# Patient Record
Sex: Male | Born: 2000
Health system: Southern US, Community
[De-identification: ages and names within clinical notes are randomized; demographics above are authoritative.]

## PROBLEM LIST (undated history)

## (undated) DIAGNOSIS — J45998 Other asthma: Secondary | ICD-10-CM

## (undated) DIAGNOSIS — Z9109 Other allergy status, other than to drugs and biological substances: Secondary | ICD-10-CM

## (undated) HISTORY — DX: Other allergy status, other than to drugs and biological substances: Z91.09

## (undated) HISTORY — DX: Other asthma: J45.998

---
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
## 2021-01-04 ENCOUNTER — Ambulatory Visit: Payer: 59 | Admitting: Physician Assistant

## 2021-01-04 ENCOUNTER — Other Ambulatory Visit: Payer: Self-pay

## 2021-01-26 ENCOUNTER — Ambulatory Visit: Payer: 59 | Admitting: Physician Assistant

## 2021-06-09 ENCOUNTER — Ambulatory Visit: Payer: 59 | Admitting: Nurse Practitioner

## 2021-11-22 ENCOUNTER — Encounter: Payer: Self-pay | Admitting: Nurse Practitioner

## 2021-11-22 ENCOUNTER — Ambulatory Visit (INDEPENDENT_AMBULATORY_CARE_PROVIDER_SITE_OTHER): Payer: 59 | Admitting: Nurse Practitioner

## 2021-11-22 VITALS — BP 126/80 | HR 103 | Temp 96.6°F | Resp 14 | Ht 71.0 in | Wt >= 6400 oz

## 2021-11-22 DIAGNOSIS — R7303 Prediabetes: Secondary | ICD-10-CM

## 2021-11-22 DIAGNOSIS — Z23 Encounter for immunization: Secondary | ICD-10-CM | POA: Diagnosis not present

## 2021-11-22 DIAGNOSIS — Z0000 Encounter for general adult medical examination without abnormal findings: Secondary | ICD-10-CM | POA: Diagnosis not present

## 2021-11-22 DIAGNOSIS — Z6841 Body Mass Index (BMI) 40.0 and over, adult: Secondary | ICD-10-CM

## 2021-11-22 DIAGNOSIS — F419 Anxiety disorder, unspecified: Secondary | ICD-10-CM | POA: Diagnosis not present

## 2021-11-22 MED ORDER — HYDROXYZINE HCL 10 MG PO TABS: 10.0000 mg | ORAL_TABLET | Freq: Two times a day (BID) | ORAL | 0 refills | Status: DC | PRN

## 2021-11-22 NOTE — Patient Instructions (Signed)
Nice to see you today I will be in touch with the labs once I have the results Make a lab appointment that is FASTINg within the next 2 weeks  I want to see you in 1 month to see how your anxiety is doing

## 2021-11-22 NOTE — Assessment & Plan Note (Signed)
Patient has intermittent anxiety.  We will refer him to therapy and start him on hydroxyzine 10 mg twice daily as needed.  Sedation precautions reviewed.  Patient will follow-up in 1 month for recheck.

## 2021-11-22 NOTE — Assessment & Plan Note (Signed)
Pending A1c in office.

## 2021-11-22 NOTE — Assessment & Plan Note (Signed)
Discussed age-appropriate immunizations and screening exams. 

## 2021-11-22 NOTE — Progress Notes (Signed)
New Patient Office Visit  Subjective    Patient ID: Geoffrey Howard, male    DOB: 2000-03-09  Age: 21 y.o. MRN: [id number]  CC:  Chief Complaint  Patient presents with   Establish Care    Previous PCP [facility].    HPI [name] presents to establish care  H: mom and dad, per patient report good home life. E: Patient has graduated high school and starting college at [employer].  Currently taking a break.  He was studying biology and taking me switch subjects to law A: Patient enjoys crafting D: Patient denies any illicit substance use inclusive of drugs or alcohol S: Patient has not been diagnosed with anxiety or depression.  No history of medication use.  No hospitalization.  No history of self injury S: Bi sexual orientation.  Patient states he has not been sexually active thus far in life.  He did repeat back to me what safe sex was  Prediabetes: last a1c was 5.8 in chart.  Patient was on metformin in the past but no longer on medication.  Obesity: Patient has a family history of the same has not been evaluated by any specialty clinic in the past  for complete physical and follow up of chronic conditions.  Immunizations: -Tetanus:2021 -Influenza: flu vaccine -Shingles: Too young -Pneumonia: Too young Covid: Pfizer  -HPV: UTD  Diet: Fair diet.  States that he eats 1-2 larger meals a day and snack. Snacks are frutia nd chips. Mainly water and sometimes coffee Exercise: No regular exercise. Intermittent. States sometimes gym and sometimes at home workout  Eye exam: PRN Dental exam: Completes semi-annually. Ortho for invisalign    Colonoscopy: Too young, currently average risk Lung Cancer Screening: N/A Dexa: N/A  PSA: Too young, currently average risk  Sleep: tries to go to bed before midngiht. Goes to sleep are 2--3. Gets up around 9-10 or even later if no plans. Sometimes feel rested. Does not snore. Use to     Outpatient Encounter Medications as of  11/22/2021  Medication Sig   hydrOXYzine (ATARAX) 10 MG tablet Take 1 tablet (10 mg total) by mouth 2 (two) times daily as needed.   fexofenadine (ALLEGRA) 30 MG tablet Take 30 mg by mouth 2 (two) times daily.   metFORMIN (GLUCOPHAGE-XR) 500 MG 24 hr tablet Take 1 tablet (500 mg total) by mouth 2 (two) times daily. (Patient not taking: Reported on 11/22/2021)   [DISCONTINUED] fluticasone (FLONASE) 50 MCG/ACT nasal spray Place 2 sprays into both nostrils daily.   [DISCONTINUED] neomycin-polymyxin-hydrocortisone (CORTISPORIN) 3.5-10000-1 OTIC suspension Place 3 drops into the right ear 3 (three) times daily.   [DISCONTINUED] VITAMIN D, ERGOCALCIFEROL, PO Take by mouth.   No facility-administered encounter medications on file as of 11/22/2021.    Past Medical History:  Diagnosis Date   Asthma in remission    Environmental allergies 2006    Past Surgical History:  Procedure Laterality Date   TONSILLECTOMY AND ADENOIDECTOMY  2006    Family History  Problem Relation Age of Onset   Anemia Mother    Kidney disease Mother    Sickle cell trait Mother    Obesity Father    Lymphoma Maternal Grandmother        history   Thyroid disease Neg Hx    Diabetes Neg Hx     Social History   Socioeconomic History   Marital status: Single    Spouse name: Not on file   Number of children: Not on file  Years of education: Not on file   Highest education level: Not on file  Occupational History   Not on file  Tobacco Use   Smoking status: Former    Types: E-cigarettes    Quit date: 2021    Years since quitting: 2.7   Smokeless tobacco: Never  Vaping Use   Vaping Use: Former   Devices: quit in 2021  Substance and Sexual Activity   Alcohol use: Not Currently   Drug use: Never   Sexual activity: Not on file  Other Topics Concern   Not on file  Social History Narrative   Lives at home with mom dad sister and niece      Highschool graduate   States in college but took a break. States  that he was studying biology and thinking of switching to law      Hobbies: enjoys Barrister's clerk and music   Social Determinants of Health   Financial Resource Strain: Not on file  Food Insecurity: Not on file  Transportation Needs: Not on file  Physical Activity: Not on file  Stress: Not on file  Social Connections: Not on file  Intimate Partner Violence: Not on file    Review of Systems  Constitutional:  Negative for chills and fever.  Respiratory:  Negative for shortness of breath.   Cardiovascular:  Negative for chest pain and leg swelling.  Gastrointestinal:  Negative for abdominal pain, blood in stool, constipation, diarrhea, nausea and vomiting.       BM daily   Genitourinary:  Negative for dysuria and hematuria.  Neurological:  Negative for tingling and headaches.  Psychiatric/Behavioral:  Negative for hallucinations and suicidal ideas. The patient is nervous/anxious and has insomnia.         Objective    BP 126/80   Pulse (!) 103   Temp (!) 96.6 F (35.9 C) (Temporal)   Resp 14   Ht 5\' 11"  (1.803 m)   Wt (!) 403 lb 4 oz (182.9 kg)   SpO2 98%   BMI 56.24 kg/m   Physical Exam Vitals and nursing note reviewed. Exam conducted with a chaperone present [facility] Correll, RMA).  Constitutional:      Appearance: Normal appearance. He is obese.  HENT:     Right Ear: Tympanic membrane, ear canal and external ear normal.     Left Ear: Tympanic membrane, ear canal and external ear normal.     Mouth/Throat:     Mouth: Mucous membranes are moist.     Pharynx: Oropharynx is clear.  Eyes:     Extraocular Movements: Extraocular movements intact.     Pupils: Pupils are equal, round, and reactive to light.  Cardiovascular:     Rate and Rhythm: Normal rate and regular rhythm.     Heart sounds: Normal heart sounds.  Pulmonary:     Effort: Pulmonary effort is normal.     Breath sounds: Normal breath sounds.  Abdominal:     General: Bowel sounds are normal. There is no  distension.     Palpations: There is no mass.     Tenderness: There is no abdominal tenderness.     Hernia: No hernia is present. There is no hernia in the left inguinal area or right inguinal area.  Genitourinary:    Penis: Normal.      Testes: Normal.     Epididymis:     Right: Normal.     Left: Normal.  Musculoskeletal:     Right lower leg: No edema.  Left lower leg: No edema.  Lymphadenopathy:     Cervical: No cervical adenopathy.     Lower Body: No right inguinal adenopathy. No left inguinal adenopathy.  Skin:    General: Skin is warm.  Neurological:     General: No focal deficit present.     Mental Status: He is alert.     Comments: Bilateral upper and lower extremity strength 5/5  Psychiatric:        Attention and Perception: Attention normal.        Mood and Affect: Mood is anxious.        Speech: Speech normal.        Behavior: Behavior normal.        Thought Content: Thought content normal.        Cognition and Memory: Cognition normal.        Judgment: Judgment normal.         Assessment & Plan:   Problem List Items Addressed This Visit       Other   Class 3 severe obesity due to excess calories without serious comorbidity with body mass index (BMI) of 50.0 to 59.9 in adult [facility])    Patient has worked on healthy lifestyle modifications in the past.  We will refer him to healthy weight and wellness for medical management his weight as he does have a family history of obesity and personal history of prediabetes      Relevant Orders   Hemoglobin A1c   TSH   Lipid panel   Amb Ref to Medical Weight Management   Prediabetes    Pending A1c in office.      Relevant Orders   Hemoglobin A1c   Amb Ref to Medical Weight Management   Preventative health care - Primary    Discussed age-appropriate immunizations and screening exams.      Relevant Orders   CBC   Comprehensive metabolic panel   Hemoglobin A1c   TSH   Lipid panel   Anxiety    Patient  has intermittent anxiety.  We will refer him to therapy and start him on hydroxyzine 10 mg twice daily as needed.  Sedation precautions reviewed.  Patient will follow-up in 1 month for recheck.      Relevant Medications   hydrOXYzine (ATARAX) 10 MG tablet   Other Relevant Orders   Ambulatory referral to Psychology   Other Visit Diagnoses     Need for influenza vaccination       Relevant Orders   Flu Vaccine QUAD 6+ mos PF IM (Fluarix Quad PF) (Completed)       Return in about 4 weeks (around 12/20/2021) for GAD f/u.   Audria Nine, NP

## 2021-11-22 NOTE — Assessment & Plan Note (Signed)
Patient has worked on healthy lifestyle modifications in the past.  We will refer him to healthy weight and wellness for medical management his weight as he does have a family history of obesity and personal history of prediabetes

## 2021-11-30 ENCOUNTER — Encounter: Payer: Self-pay | Admitting: Pediatrics

## 2021-12-06 ENCOUNTER — Other Ambulatory Visit (INDEPENDENT_AMBULATORY_CARE_PROVIDER_SITE_OTHER): Payer: 59

## 2021-12-06 DIAGNOSIS — Z6841 Body Mass Index (BMI) 40.0 and over, adult: Secondary | ICD-10-CM

## 2021-12-06 DIAGNOSIS — R7303 Prediabetes: Secondary | ICD-10-CM

## 2021-12-06 DIAGNOSIS — Z0000 Encounter for general adult medical examination without abnormal findings: Secondary | ICD-10-CM

## 2021-12-06 LAB — COMPREHENSIVE METABOLIC PANEL
ALT: 16 U/L (ref 0–53)
AST: 14 U/L (ref 0–37)
Albumin: 4 g/dL (ref 3.5–5.2)
Alkaline Phosphatase: 63 U/L (ref 39–117)
BUN: 10 mg/dL (ref 6–23)
CO2: 27 mEq/L (ref 19–32)
Calcium: 9.1 mg/dL (ref 8.4–10.5)
Chloride: 104 mEq/L (ref 96–112)
Creatinine, Ser: 0.83 mg/dL (ref 0.40–1.50)
GFR: 126.41 mL/min (ref >60.00)
Glucose, Bld: 86 mg/dL (ref 70–99)
Potassium: 4.4 mEq/L (ref 3.5–5.1)
Sodium: 139 mEq/L (ref 135–145)
Total Bilirubin: 0.4 mg/dL (ref 0.2–1.2)
Total Protein: 6.6 g/dL (ref 6.0–8.3)

## 2021-12-06 LAB — LIPID PANEL
Cholesterol: 124 mg/dL (ref 0–200)
HDL: 38.6 mg/dL — ABNORMAL LOW (ref >39.00)
LDL Cholesterol: 74 mg/dL (ref 0–99)
NonHDL: 85.5
Total CHOL/HDL Ratio: 3
Triglycerides: 58 mg/dL (ref 0.0–149.0)
VLDL: 11.6 mg/dL (ref 0.0–40.0)

## 2021-12-06 LAB — CBC
HCT: 42.4 % (ref 39.0–52.0)
Hemoglobin: 13.9 g/dL (ref 13.0–17.0)
MCHC: 32.9 g/dL (ref 30.0–36.0)
MCV: 77.9 fl — ABNORMAL LOW (ref 78.0–100.0)
Platelets: 288 10*3/uL (ref 150.0–400.0)
RBC: 5.43 Mil/uL (ref 4.22–5.81)
RDW: 15.6 % — ABNORMAL HIGH (ref 11.5–14.6)
WBC: 11.2 10*3/uL — ABNORMAL HIGH (ref 4.5–10.5)

## 2021-12-06 LAB — HEMOGLOBIN A1C: Hgb A1c MFr Bld: 6.1 % (ref 4.6–6.5)

## 2021-12-06 LAB — TSH: TSH: 4.42 u[IU]/mL (ref 0.35–5.50)

## 2021-12-08 NOTE — Addendum Note (Signed)
Addended by: Michela Pitcher on: 12/08/2021 04:49 PM   Modules accepted: Orders

## 2021-12-20 ENCOUNTER — Ambulatory Visit: Payer: 59 | Admitting: Nurse Practitioner

## 2021-12-21 ENCOUNTER — Encounter (INDEPENDENT_AMBULATORY_CARE_PROVIDER_SITE_OTHER): Payer: Self-pay

## 2022-02-09 ENCOUNTER — Ambulatory Visit (INDEPENDENT_AMBULATORY_CARE_PROVIDER_SITE_OTHER): Payer: 59 | Admitting: Nurse Practitioner

## 2022-02-09 ENCOUNTER — Encounter: Payer: Self-pay | Admitting: Nurse Practitioner

## 2022-02-09 VITALS — BP 128/66 | HR 90 | Temp 98.4°F | Resp 16 | Ht 71.0 in | Wt >= 6400 oz

## 2022-02-09 DIAGNOSIS — R21 Rash and other nonspecific skin eruption: Secondary | ICD-10-CM | POA: Insufficient documentation

## 2022-02-09 DIAGNOSIS — F419 Anxiety disorder, unspecified: Secondary | ICD-10-CM | POA: Diagnosis not present

## 2022-02-09 MED ORDER — METHYLPREDNISOLONE ACETATE 80 MG/ML IJ SUSP
80.0000 mg | Freq: Once | INTRAMUSCULAR | Status: AC
  Administered 2022-02-09: 80 mg via INTRAMUSCULAR

## 2022-02-09 MED ORDER — BUSPIRONE HCL 5 MG PO TABS: 5.0000 mg | ORAL_TABLET | Freq: Two times a day (BID) | ORAL | 1 refills | Status: DC

## 2022-02-09 MED ORDER — TRIAMCINOLONE ACETONIDE 0.1 % EX CREA: 1.0000 | TOPICAL_CREAM | Freq: Two times a day (BID) | CUTANEOUS | 0 refills | Status: AC

## 2022-02-09 NOTE — Assessment & Plan Note (Signed)
Patient has not heard from psychology therapist yet.  Patient was given information to call and schedule appointment.  Patient was taking hydroxyzine but felt worse the day of after taking medication in the next day.  Will discontinue hydroxyzine start BuSpar 5 mg twice daily.  Patient to follow-up in 6 weeks

## 2022-02-09 NOTE — Assessment & Plan Note (Signed)
Patient was given Depo-Medrol 80 mg IM x 1 dose.  Tolerated injection well.  Will send in Kenalog 0.1% cream to use twice daily for 1 week.  Steroid precautions reviewed inclusive of atrophy of skin and lightening of pigmentation of skin.  Patient will follow-up if no improvement.  Encourage patient to continue using anomic cream such as Cetaphil.

## 2022-02-09 NOTE — Patient Instructions (Signed)
I have sent in a new medication for the anxiety. STOP they hydroxyzine. I sent in a cream for the face use it twice a day for 7 days, no longer unless you clear it through me. I want to see you in 6 weeks for a recheck   Call this number to set up the therapy sessions [phone]

## 2022-02-09 NOTE — Progress Notes (Signed)
Established Patient Office Visit  Subjective   Patient ID: Geoffrey Howard, male    DOB: 2000-08-21  Age: 21 y.o. MRN: [id number]  Chief Complaint  Patient presents with   Rash    On face dry and itchy X 1 month   Anxiety      Anxiety: States that he was prescribed hydroyzine and he mentioes that he felt that he was more anxious or felt different. He is having a hard time expressing how it made him. States that day after takin ghte medication he would be more anxious. He states that his anxiety has increased. States that he has not started therapy    Rash: states that he has a rash on the face that is itchy and scratchy. States that he would scrtacy it. Then he would shave it and then scratch it more. Has been going on for approx 1 month. No changes in facial products. States that he has tried cedifl cream that helped.  States that he has been using Hyrularonic acid that has helped. No other meds or creams. Felt it has improved with the cream use   Review of Systems  Constitutional:  Negative for chills and fever.  Respiratory:  Negative for sputum production.   Cardiovascular:  Negative for chest pain.  Skin:  Positive for itching and rash.  Psychiatric/Behavioral:  Negative for hallucinations and suicidal ideas.       Objective:     BP 128/66   Pulse 90   Temp 98.4 F (36.9 C)   Resp 16   Ht 5\' 11"  (1.803 m)   Wt (!) 407 lb 4 oz (184.7 kg)   SpO2 97%   BMI 56.80 kg/m    Physical Exam Vitals and nursing note reviewed.  Constitutional:      Appearance: Normal appearance. He is obese.  Cardiovascular:     Rate and Rhythm: Normal rate and regular rhythm.     Heart sounds: Normal heart sounds.  Pulmonary:     Effort: Pulmonary effort is normal.     Breath sounds: Normal breath sounds.  Skin:         Comments: Redness with some skin cracking to bilateral inferior jaws  Neurological:     Mental Status: He is alert.      No results found for any visits on  02/09/22.    The ASCVD Risk score (Arnett DK, et al., 2019) failed to calculate for the following reasons:   The 2019 ASCVD risk score is only valid for ages 86 to 63    Assessment & Plan:   Problem List Items Addressed This Visit       Musculoskeletal and Integument   Rash - Primary    Patient was given Depo-Medrol 80 mg IM x 1 dose.  Tolerated injection well.  Will send in Kenalog 0.1% cream to use twice daily for 1 week.  Steroid precautions reviewed inclusive of atrophy of skin and lightening of pigmentation of skin.  Patient will follow-up if no improvement.  Encourage patient to continue using anomic cream such as Cetaphil.      Relevant Medications   triamcinolone cream (KENALOG) 0.1 %     Other   Anxiety    Patient has not heard from psychology therapist yet.  Patient was given information to call and schedule appointment.  Patient was taking hydroxyzine but felt worse the day of after taking medication in the next day.  Will discontinue hydroxyzine start BuSpar 5 mg twice daily.  Patient to follow-up in 6 weeks      Relevant Medications   busPIRone (BUSPAR) 5 MG tablet    Return in about 6 weeks (around 03/23/2022) for Anxiety recheck .    Audria Nine, NP

## 2022-03-23 ENCOUNTER — Ambulatory Visit: Payer: 59 | Admitting: Nurse Practitioner

## 2022-04-26 ENCOUNTER — Ambulatory Visit (INDEPENDENT_AMBULATORY_CARE_PROVIDER_SITE_OTHER): Payer: 59 | Admitting: Nurse Practitioner

## 2022-04-26 ENCOUNTER — Encounter: Payer: Self-pay | Admitting: Nurse Practitioner

## 2022-04-26 VITALS — BP 150/90 | HR 95 | Temp 97.4°F | Resp 16 | Ht 71.0 in | Wt >= 6400 oz

## 2022-04-26 DIAGNOSIS — F419 Anxiety disorder, unspecified: Secondary | ICD-10-CM

## 2022-04-26 DIAGNOSIS — R030 Elevated blood-pressure reading, without diagnosis of hypertension: Secondary | ICD-10-CM | POA: Diagnosis not present

## 2022-04-26 DIAGNOSIS — R21 Rash and other nonspecific skin eruption: Secondary | ICD-10-CM

## 2022-04-26 MED ORDER — BUSPIRONE HCL 5 MG PO TABS: 5.0000 mg | ORAL_TABLET | Freq: Two times a day (BID) | ORAL | 2 refills | Status: DC

## 2022-04-26 NOTE — Progress Notes (Signed)
Established Patient Office Visit  Subjective   Patient ID: Geoffrey Howard, male    DOB: [birth year]  Age: 22 y.o. MRN: [id number]  Chief Complaint  Patient presents with   Anxiety      Anxiety: Patient was prior prescribed hydroxyzine in the he felt that the medication was making him more anxious or made him feel different.  States the day after he took the medication he would be more anxious he felt like his anxiety had increased.  At the last office visit patient had not started therapy.  Patient was started on buspirone 5 mg twice daily at last office visit and is here for follow-up.  States that he was doing well with the medication. States that he has been out of the medication since the beginning of the month. States that when he was on the medicaoitn it was helping States that he did have a panic attack at work approx 1.5 weeks ago. States that he had to step away and lasted 15-20 mins. States tahat he was      04/26/2022    8:16 AM 02/09/2022    4:26 PM 11/22/2021    4:24 PM  PHQ9 SCORE ONLY  PHQ-9 Total Score '6 10 11       '$ 04/26/2022    8:17 AM 02/09/2022    4:27 PM 11/22/2021    4:24 PM  GAD 7 : Generalized Anxiety Score  Nervous, Anxious, on Edge '3 3 2  '$ Control/stop worrying '1 2 1  '$ Worry too much - different things '2 3 2  '$ Trouble relaxing '2 1 1  '$ Restless '1 1 1  '$ Easily annoyed or irritable '2 1 1  '$ Afraid - awful might happen '3 2 2  '$ Total GAD 7 Score '14 13 10  '$ Anxiety Difficulty Not difficult at all Somewhat difficult Somewhat difficult       Review of Systems  Constitutional:  Negative for chills and fever.  Respiratory:  Negative for shortness of breath.   Cardiovascular:  Negative for chest pain.  Neurological:  Negative for headaches.  Psychiatric/Behavioral:  Negative for hallucinations and suicidal ideas. The patient has insomnia.       Objective:     BP (!) 150/90   Pulse 95   Temp (!) 97.4 F (36.3 C)   Resp 16   Ht '5\' 11"'$  (1.803 m)   Wt (!) 407  lb 8 oz (184.8 kg)   SpO2 98%   BMI 56.83 kg/m  BP Readings from Last 3 Encounters:  04/26/22 (!) 150/90  02/09/22 128/66  11/22/21 126/80   Wt Readings from Last 3 Encounters:  04/26/22 (!) 407 lb 8 oz (184.8 kg)  02/09/22 (!) 407 lb 4 oz (184.7 kg)  11/22/21 (!) 403 lb 4 oz (182.9 kg) (>99 %, Z= 3.93)*   * Growth percentiles are based on CDC (Boys, 2-20 Years) data.      Physical Exam Vitals and nursing note reviewed.  Constitutional:      Appearance: Normal appearance. He is obese.  Cardiovascular:     Rate and Rhythm: Normal rate and regular rhythm.     Heart sounds: Normal heart sounds.  Pulmonary:     Effort: Pulmonary effort is normal.     Breath sounds: Normal breath sounds.  Neurological:     Mental Status: He is alert.      No results found for any visits on 04/26/22.    The ASCVD Risk score (Arnett DK, et al., 2019) failed to calculate  for the following reasons:   The 2019 ASCVD risk score is only valid for ages 80 to 65    Assessment & Plan:   Problem List Items Addressed This Visit       Musculoskeletal and Integument   Rash    Patient was prescribed triamcinolone for me he did follow up with a dermatologist and was given triamcinolone for body and hydrocortisone 2.5% for the face.  Rashes resolved.  Continue following dermatology as needed        Other   Anxiety - Primary    We did trial patient on hydroxyzine without great relief.  Patient was then placed on BuSpar 5 mg twice daily per patient report he felt like it did well.  Patient is been off of medication for a month.  He did miss his appointments withdrew him back in office as he had a panic attack at work.  Will start patient back on BuSpar 5 mg twice daily.  He will follow-up if he feels like this is not adequate control.  Did encourage patient to enroll in therapy patient was given behavioral health phone number to call and get this set up as a referral was already been placed.       Relevant Medications   busPIRone (BUSPAR) 5 MG tablet   Elevated blood pressure reading    Patient's blood pressure was elevated in office today.  Previous to office he was normotensive.       Return in about 7 months (around 11/26/2022) for CPE and Labs.    Romilda Garret, NP

## 2022-04-26 NOTE — Assessment & Plan Note (Signed)
We did trial patient on hydroxyzine without great relief.  Patient was then placed on BuSpar 5 mg twice daily per patient report he felt like it did well.  Patient is been off of medication for a month.  He did miss his appointments withdrew him back in office as he had a panic attack at work.  Will start patient back on BuSpar 5 mg twice daily.  He will follow-up if he feels like this is not adequate control.  Did encourage patient to enroll in therapy patient was given behavioral health phone number to call and get this set up as a referral was already been placed.

## 2022-04-26 NOTE — Assessment & Plan Note (Signed)
Patient's blood pressure was elevated in office today.  Previous to office he was normotensive.

## 2022-04-26 NOTE — Assessment & Plan Note (Signed)
Patient was prescribed triamcinolone for me he did follow up with a dermatologist and was given triamcinolone for body and hydrocortisone 2.5% for the face.  Rashes resolved.  Continue following dermatology as needed

## 2022-04-26 NOTE — Patient Instructions (Addendum)
Nice to see you today I have refilled the Buspar medication. If you need me before it is time for your physical I plan to see you at the beginning of October for your next physical.   If this medication does not seem like enough or helping enough let me know via mychart or make an appointment   Call [phone] to get therapy set up

## 2022-06-12 ENCOUNTER — Ambulatory Visit (HOSPITAL_COMMUNITY): Payer: Self-pay

## 2022-07-07 ENCOUNTER — Ambulatory Visit (INDEPENDENT_AMBULATORY_CARE_PROVIDER_SITE_OTHER): Payer: 59 | Admitting: Nurse Practitioner

## 2022-07-07 ENCOUNTER — Other Ambulatory Visit: Payer: Self-pay | Admitting: Nurse Practitioner

## 2022-07-07 ENCOUNTER — Ambulatory Visit
Admission: RE | Admit: 2022-07-07 | Discharge: 2022-07-07 | Disposition: A | Discharge status: Home / Self Care | Payer: BC Managed Care – PPO | Source: Ambulatory Visit | Attending: Nurse Practitioner | Admitting: Nurse Practitioner

## 2022-07-07 ENCOUNTER — Encounter: Payer: Self-pay | Admitting: Nurse Practitioner

## 2022-07-07 VITALS — BP 134/88 | HR 100 | Temp 98.5°F | Resp 16 | Ht 71.0 in | Wt >= 6400 oz

## 2022-07-07 DIAGNOSIS — R0982 Postnasal drip: Secondary | ICD-10-CM

## 2022-07-07 DIAGNOSIS — R052 Subacute cough: Secondary | ICD-10-CM

## 2022-07-07 MED ORDER — FLUTICASONE PROPIONATE 50 MCG/ACT NA SUSP: 2.0000 | Freq: Every day | NASAL | 0 refills | Status: AC

## 2022-07-07 NOTE — Assessment & Plan Note (Signed)
Will treat with Flonase 2 sprays each nostril daily.  Pending chest x-ray for cough.

## 2022-07-07 NOTE — Assessment & Plan Note (Signed)
Lungs clear to auscultation.  Will obtain chest x-ray, pending result.  Fluticasone nasal spray as directed

## 2022-07-07 NOTE — Progress Notes (Signed)
Acute Office Visit  Subjective:     Patient ID: Geoffrey Howard, male    DOB: August 22, 2000, 22 y.o.   MRN: [id number]  Chief Complaint  Patient presents with   Cough    Over the last 2 weeks. Feels like a deep cough     Patient is in today for cough with a history of prediabetes, anxiety, and obesity.  Symptoms started approx 2 weeks Got treated for ear infection 3 weeks ago and was treated with amoxicllin  Covid vaccine: pfizer x2  Flu vaccine: completed this season  Sick contacts: none. States that he was the first to get sick.   Treatment: was treated with amoxicilling, cough drop, mucinex DM that has helped some  States that the cough is improving states that it is less frequent. He states that it is worse in the evenings and at night    Review of Systems  Constitutional:  Negative for chills and fever.       Appetite is some what normal Fluid intake is normal   HENT:  Positive for ear pain (full). Negative for sinus pain and sore throat.   Respiratory:  Positive for cough and sputum production. Negative for shortness of breath.   Gastrointestinal:  Negative for abdominal pain, diarrhea, nausea and vomiting.  Musculoskeletal:  Negative for joint pain and myalgias.  Neurological:  Positive for headaches.        Objective:    BP 134/88   Pulse 100   Temp 98.5 F (36.9 C)   Resp 16   Ht 5\' 11"  (1.803 m)   Wt (!) 407 lb 6 oz (184.8 kg)   SpO2 98%   BMI 56.82 kg/m  BP Readings from Last 3 Encounters:  07/07/22 134/88  04/26/22 (!) 150/90  02/09/22 128/66   Wt Readings from Last 3 Encounters:  07/07/22 (!) 407 lb 6 oz (184.8 kg)  04/26/22 (!) 407 lb 8 oz (184.8 kg)  02/09/22 (!) 407 lb 4 oz (184.7 kg)      Physical Exam Vitals and nursing note reviewed.  Constitutional:      Appearance: Normal appearance. He is obese.  HENT:     Right Ear: Tympanic membrane, ear canal and external ear normal.     Left Ear: Tympanic membrane, ear canal and external ear  normal.     Mouth/Throat:     Mouth: Mucous membranes are moist.     Pharynx: Oropharynx is clear.  Cardiovascular:     Rate and Rhythm: Normal rate and regular rhythm.     Heart sounds: Normal heart sounds.  Pulmonary:     Effort: Pulmonary effort is normal.     Breath sounds: Normal breath sounds.  Lymphadenopathy:     Cervical: No cervical adenopathy.  Neurological:     Mental Status: He is alert.     No results found for any visits on 07/07/22.      Assessment & Plan:   Problem List Items Addressed This Visit       Other   PND (post-nasal drip)    Will treat with Flonase 2 sprays each nostril daily.  Pending chest x-ray for cough.      Relevant Medications   fluticasone (FLONASE) 50 MCG/ACT nasal spray   Subacute cough - Primary    Lungs clear to auscultation.  Will obtain chest x-ray, pending result.  Fluticasone nasal spray as directed      Relevant Orders   DG Chest 2 View  Meds ordered this encounter  Medications   fluticasone (FLONASE) 50 MCG/ACT nasal spray    Sig: Place 2 sprays into both nostrils daily.    Dispense:  16 g    Refill:  0    Order Specific Question:   Supervising Provider    Answer:   Roxy Manns A [[id number]]    Return in about 5 months (around 12/07/2022), or if symptoms worsen or fail to improve, for CPE and Labs.  Audria Nine, NP

## 2022-07-07 NOTE — Patient Instructions (Signed)
Nice to see you today Go to the [facility] location and get the chest xray I have sent in a nasal spray to help with the cough

## 2022-08-30 ENCOUNTER — Ambulatory Visit (INDEPENDENT_AMBULATORY_CARE_PROVIDER_SITE_OTHER): Payer: 59 | Admitting: Family Medicine

## 2022-08-30 ENCOUNTER — Encounter: Payer: Self-pay | Admitting: Family Medicine

## 2022-08-30 VITALS — BP 138/86 | HR 96 | Temp 97.7°F | Ht 71.0 in | Wt >= 6400 oz

## 2022-08-30 DIAGNOSIS — H60502 Unspecified acute noninfective otitis externa, left ear: Secondary | ICD-10-CM | POA: Diagnosis not present

## 2022-08-30 MED ORDER — NEOMYCIN-POLYMYXIN-HC 3.5-10000-1 OT SOLN: 4.0000 [drp] | Freq: Four times a day (QID) | OTIC | 0 refills | Status: AC

## 2022-08-30 NOTE — Assessment & Plan Note (Signed)
Acute, treat with topical Cortisporin otic 4 times daily for 5 to 7 days. Discussed possible external ear infection and methods to avoid.  Turn and ER precautions provided.

## 2022-08-30 NOTE — Progress Notes (Signed)
Patient ID: Geoffrey Howard, male    DOB: 2000-06-26, 22 y.o.   MRN: [id number]  This visit was conducted in person.  BP 138/86 (BP Location: Left Arm, Patient Position: Sitting, Cuff Size: Large)   Pulse 96   Temp 97.7 F (36.5 C) (Temporal)   Ht 5\' 11"  (1.803 m)   Wt (!) 418 lb 2 oz (189.7 kg)   SpO2 97%   BMI 58.32 kg/m    CC:  Chief Complaint  Patient presents with   Ear Pain    Left-Started Saturday night    Subjective:   HPI: Geoffrey Howard is a 22 y.o. male presenting on 08/30/2022 for Ear Pain (Left-Started Saturday night)   Date of onset:  4 days ago Initial symptoms included  left ear pain  Has some productive cough in AMs.  No ST.  No fever.  No SOB.    Sick contacts:  none COVID testing:   none     He has tried to treat with  tylenol.     No history of chronic lung disease such as asthma or COPD. Non-smoker.   Using allegra  daily for allergies.     Last ear infection 05/2022 at urgent care  Last antibiotic in [facility]  in 06/2022 Reviewed last office visit with PCP  Relevant past medical, surgical, family and social history reviewed and updated as indicated. Interim medical history since our last visit reviewed. Allergies and medications reviewed and updated. Outpatient Medications Prior to Visit  Medication Sig Dispense Refill   busPIRone (BUSPAR) 5 MG tablet Take 1 tablet (5 mg total) by mouth 2 (two) times daily. 180 tablet 2   fexofenadine (ALLEGRA) 30 MG tablet Take 30 mg by mouth 2 (two) times daily.     fluticasone (FLONASE) 50 MCG/ACT nasal spray Place 2 sprays into both nostrils daily. 16 g 0   hydrocortisone 2.5 % cream Apply 1 Application topically as needed.     SSD 1 % cream Apply 1 Application topically as needed.     triamcinolone cream (KENALOG) 0.1 % Apply 1 Application topically 2 (two) times daily. 30 g 0   hydrOXYzine (ATARAX) 10 MG tablet Take 1 tablet (10 mg total) by mouth 2 (two) times daily as needed. 30 tablet 0   No  facility-administered medications prior to visit.     Per HPI unless specifically indicated in ROS section below Review of Systems  Constitutional:  Negative for fatigue and fever.  HENT:  Positive for ear pain.   Eyes:  Negative for pain.  Respiratory:  Negative for cough and shortness of breath.   Cardiovascular:  Negative for chest pain, palpitations and leg swelling.  Gastrointestinal:  Negative for abdominal pain.  Genitourinary:  Negative for dysuria.  Musculoskeletal:  Negative for arthralgias.  Neurological:  Negative for syncope, light-headedness and headaches.  Psychiatric/Behavioral:  Negative for dysphoric mood.    Objective:  BP 138/86 (BP Location: Left Arm, Patient Position: Sitting, Cuff Size: Large)   Pulse 96   Temp 97.7 F (36.5 C) (Temporal)   Ht 5\' 11"  (1.803 m)   Wt (!) 418 lb 2 oz (189.7 kg)   SpO2 97%   BMI 58.32 kg/m   Wt Readings from Last 3 Encounters:  08/30/22 (!) 418 lb 2 oz (189.7 kg)  07/07/22 (!) 407 lb 6 oz (184.8 kg)  04/26/22 (!) 407 lb 8 oz (184.8 kg)      Physical Exam Constitutional:      Appearance: He  is well-developed.  HENT:     Head: Normocephalic.     Right Ear: Hearing, tympanic membrane, ear canal and external ear normal.     Left Ear: Hearing normal. Drainage, swelling and tenderness present.  No middle ear effusion. There is no impacted cerumen. Tympanic membrane is not injected or scarred.     Nose: Nose normal.  Neck:     Thyroid: No thyroid mass or thyromegaly.     Vascular: No carotid bruit.     Trachea: Trachea normal.  Cardiovascular:     Rate and Rhythm: Normal rate and regular rhythm.     Pulses: Normal pulses.     Heart sounds: Heart sounds not distant. No murmur heard.    No friction rub. No gallop.     Comments: No peripheral edema Pulmonary:     Effort: Pulmonary effort is normal. No respiratory distress.     Breath sounds: Normal breath sounds.  Skin:    General: Skin is warm and dry.     Findings: No  rash.  Psychiatric:        Speech: Speech normal.        Behavior: Behavior normal.        Thought Content: Thought content normal.       Results for orders placed or performed in visit on 12/06/21  Lipid panel  Result Value Ref Range   Cholesterol 124 0 - 200 mg/dL   Triglycerides 16.1 0.0 - 149.0 mg/dL   HDL 09.60 (L) >45.40 mg/dL   VLDL 98.1 0.0 - 19.1 mg/dL   LDL Cholesterol 74 0 - 99 mg/dL   Total CHOL/HDL Ratio 3    NonHDL 85.50   TSH  Result Value Ref Range   TSH 4.42 0.35 - 5.50 uIU/mL  Hemoglobin A1c  Result Value Ref Range   Hgb A1c MFr Bld 6.1 4.6 - 6.5 %  Comprehensive metabolic panel  Result Value Ref Range   Sodium 139 135 - 145 mEq/L   Potassium 4.4 3.5 - 5.1 mEq/L   Chloride 104 96 - 112 mEq/L   CO2 27 19 - 32 mEq/L   Glucose, Bld 86 70 - 99 mg/dL   BUN 10 6 - 23 mg/dL   Creatinine, Ser 4.78 0.40 - 1.50 mg/dL   Total Bilirubin 0.4 0.2 - 1.2 mg/dL   Alkaline Phosphatase 63 39 - 117 U/L   AST 14 0 - 37 U/L   ALT 16 0 - 53 U/L   Total Protein 6.6 6.0 - 8.3 g/dL   Albumin 4.0 3.5 - 5.2 g/dL   GFR 295.62 >13.08 mL/min   Calcium 9.1 8.4 - 10.5 mg/dL  CBC  Result Value Ref Range   WBC 11.2 (H) 4.5 - 10.5 K/uL   RBC 5.43 4.22 - 5.81 Mil/uL   Platelets 288.0 150.0 - 400.0 K/uL   Hemoglobin 13.9 13.0 - 17.0 g/dL   HCT 65.7 84.6 - 96.2 %   MCV 77.9 (L) 78.0 - 100.0 fl   MCHC 32.9 30.0 - 36.0 g/dL   RDW 95.2 (H) 84.1 - 32.4 %    Assessment and Plan  Acute otitis externa of left ear, unspecified type Assessment & Plan: Acute, treat with topical Cortisporin otic 4 times daily for 5 to 7 days. Discussed possible external ear infection and methods to avoid.  Turn and ER precautions provided.   Other orders -     Neomycin-Polymyxin-HC; Place 4 drops into the left ear 4 (four) times daily.  Dispense:  10 mL; Refill: 0    No follow-ups on file.   Kerby Nora, MD

## 2023-06-16 ENCOUNTER — Ambulatory Visit: Admitting: Nurse Practitioner

## 2023-07-05 ENCOUNTER — Ambulatory Visit: Admitting: Nurse Practitioner

## 2024-02-29 ENCOUNTER — Encounter: Payer: Self-pay | Admitting: Nurse Practitioner

## 2024-02-29 ENCOUNTER — Ambulatory Visit: Admitting: Nurse Practitioner

## 2024-02-29 VITALS — BP 136/88 | HR 97 | Temp 98.2°F | Ht 71.0 in | Wt 353.4 lb

## 2024-02-29 DIAGNOSIS — F419 Anxiety disorder, unspecified: Secondary | ICD-10-CM | POA: Diagnosis not present

## 2024-02-29 DIAGNOSIS — Z13220 Encounter for screening for lipoid disorders: Secondary | ICD-10-CM | POA: Diagnosis not present

## 2024-02-29 DIAGNOSIS — Z0001 Encounter for general adult medical examination with abnormal findings: Secondary | ICD-10-CM

## 2024-02-29 DIAGNOSIS — R7303 Prediabetes: Secondary | ICD-10-CM | POA: Diagnosis not present

## 2024-02-29 DIAGNOSIS — Z23 Encounter for immunization: Secondary | ICD-10-CM | POA: Diagnosis not present

## 2024-02-29 DIAGNOSIS — Z0000 Encounter for general adult medical examination without abnormal findings: Secondary | ICD-10-CM

## 2024-02-29 LAB — LIPID PANEL
Cholesterol: 123 mg/dL (ref 28–200)
HDL: 39.7 mg/dL
LDL Cholesterol: 78 mg/dL (ref 10–99)
NonHDL: 83.73
Total CHOL/HDL Ratio: 3
Triglycerides: 30 mg/dL (ref 10.0–149.0)
VLDL: 6 mg/dL (ref 0.0–40.0)

## 2024-02-29 LAB — COMPREHENSIVE METABOLIC PANEL WITH GFR
ALT: 15 U/L (ref 3–53)
AST: 14 U/L (ref 5–37)
Albumin: 4.1 g/dL (ref 3.5–5.2)
Alkaline Phosphatase: 48 U/L (ref 39–117)
BUN: 11 mg/dL (ref 6–23)
CO2: 26 meq/L (ref 19–32)
Calcium: 9.2 mg/dL (ref 8.4–10.5)
Chloride: 107 meq/L (ref 96–112)
Creatinine, Ser: 0.76 mg/dL (ref 0.40–1.50)
GFR: 127.8 mL/min
Glucose, Bld: 84 mg/dL (ref 70–99)
Potassium: 4.3 meq/L (ref 3.5–5.1)
Sodium: 140 meq/L (ref 135–145)
Total Bilirubin: 0.5 mg/dL (ref 0.2–1.2)
Total Protein: 6.9 g/dL (ref 6.0–8.3)

## 2024-02-29 LAB — CBC WITH DIFFERENTIAL/PLATELET
Basophils Absolute: 0 K/uL (ref 0.0–0.1)
Basophils Relative: 0.3 % (ref 0.0–3.0)
Eosinophils Absolute: 0.1 K/uL (ref 0.0–0.7)
Eosinophils Relative: 0.9 % (ref 0.0–5.0)
HCT: 43.7 % (ref 39.0–52.0)
Hemoglobin: 15 g/dL (ref 13.0–17.0)
Lymphocytes Relative: 30.5 % (ref 12.0–46.0)
Lymphs Abs: 2.4 K/uL (ref 0.7–4.0)
MCHC: 34.3 g/dL (ref 30.0–36.0)
MCV: 79.4 fl (ref 78.0–100.0)
Monocytes Absolute: 0.5 K/uL (ref 0.1–1.0)
Monocytes Relative: 6.6 % (ref 3.0–12.0)
Neutro Abs: 4.9 K/uL (ref 1.4–7.7)
Neutrophils Relative %: 61.7 % (ref 43.0–77.0)
Platelets: 308 K/uL (ref 150.0–400.0)
RBC: 5.5 Mil/uL (ref 4.22–5.81)
RDW: 14.3 % (ref 11.5–15.5)
WBC: 7.9 K/uL (ref 4.0–10.5)

## 2024-02-29 LAB — TSH: TSH: 0.97 u[IU]/mL (ref 0.35–5.50)

## 2024-02-29 LAB — HEMOGLOBIN A1C: Hgb A1c MFr Bld: 5.5 % (ref 4.6–6.5)

## 2024-02-29 MED ORDER — SERTRALINE HCL 50 MG PO TABS: ORAL_TABLET | ORAL | 0 refills | Status: AC

## 2024-02-29 NOTE — Assessment & Plan Note (Signed)
 Discussed age-appropriate immunizations and screening exams.  Did review patient's personal, surgical, social, family history.  Patient is up-to-date with all age-appropriate vaccinations he would like.  Update flu vaccine today.  Patient is too young for CRC screening or prostate cancer screening.  Patient was given information at discharge about preventative healthcare maintenance with anticipatory guidance.  Patient declines STI testing today

## 2024-02-29 NOTE — Assessment & Plan Note (Signed)
 History of the same pending A1c.  Patient currently working on lifestyle modification and has lost approximately 55 pounds

## 2024-02-29 NOTE — Assessment & Plan Note (Signed)
 History of the same.  Patient was maintained on BuSpar  without great relief.  Will start patient on sertraline  25 mg daily for 10 days then increase to 50 mg daily thereafter.  Referral to therapy also.  Patient denies HI/SI/AVH.

## 2024-02-29 NOTE — Progress Notes (Signed)
 "  Established Patient Office Visit  Subjective   Patient ID: Geoffrey Howard, male    DOB: [birth year]  Age: 24 y.o. MRN: [id number]  Chief Complaint  Patient presents with   Annual Exam    HPI   Allergies: Patient currently maintained on Allegra and Flonase  as needed. He will take the allergra as needed   Anxiety: he was on buspar  in the past. States that he still had some anxiety but it was toned down some.  He will get a stomach ache and then it will raise to the chest and be restless some shortness of breath. States that it will not keep him up normally. But it can keep him up    for complete physical and follow up of chronic conditions.  Immunizations: -Tetanus: Completed in 2021 -Influenza: up date today  -Shingles: Too young -Pneumonia: Too young -HPV: Up-to-date  Diet: Fair diet. He is eating when he feels hungry. He will snack on ocassion. He will drink water and sometimes coffee. Shop made energy drink Exercise:  He had a trainer for last year. He is going to the gym 2-3 times a week for 60 mins   Eye exam: Completes every other year. glasses  Dental exam: needs updating     Colonoscopy: Too young, currently average risk Lung Cancer Screening: N/A  PSA: Too young, currently average risk  Sleep: going to bed around 12 and up 4-8 depending on work. He does feel rested. States that he use to snore   STI: does not want to be screen      04/26/2022    8:16 AM 02/09/2022    4:26 PM 11/22/2021    4:24 PM  PHQ9 SCORE ONLY  PHQ-9 Total Score 6  10  11       Data saved with a previous flowsheet row definition       04/26/2022    8:17 AM 02/09/2022    4:27 PM 11/22/2021    4:24 PM  GAD 7 : Generalized Anxiety Score  Nervous, Anxious, on Edge 3 3 2   Control/stop worrying 1 2 1   Worry too much - different things 2 3 2   Trouble relaxing 2 1 1   Restless 1 1 1   Easily annoyed or irritable 2 1 1   Afraid - awful might happen 3 2 2   Total GAD 7 Score 14 13 10   Anxiety  Difficulty Not difficult at all Somewhat difficult Somewhat difficult         Review of Systems  Constitutional:  Negative for chills and fever.  Respiratory:  Negative for shortness of breath.   Cardiovascular:  Negative for chest pain and leg swelling.  Gastrointestinal:  Negative for abdominal pain, blood in stool, constipation, diarrhea, nausea and vomiting.       BM daily   Genitourinary:  Negative for dysuria and hematuria.  Neurological:  Negative for dizziness, tingling and headaches.  Psychiatric/Behavioral:  Negative for hallucinations and suicidal ideas.       Objective:     BP 136/88   Pulse 97   Temp 98.2 F (36.8 C) (Oral)   Ht 5' 11 (1.803 m)   Wt (!) 353 lb 6.4 oz (160.3 kg)   SpO2 95%   BMI 49.29 kg/m  BP Readings from Last 3 Encounters:  02/29/24 136/88  08/30/22 138/86  07/07/22 134/88   Wt Readings from Last 3 Encounters:  02/29/24 (!) 353 lb 6.4 oz (160.3 kg)  08/30/22 (!) 418 lb 2 oz (189.7 kg)  07/07/22 (!) 407 lb 6 oz (184.8 kg)   SpO2 Readings from Last 3 Encounters:  02/29/24 95%  08/30/22 97%  07/07/22 98%      Physical Exam Vitals and nursing note reviewed.  Constitutional:      Appearance: Normal appearance. He is obese.  HENT:     Right Ear: Tympanic membrane, ear canal and external ear normal.     Left Ear: Tympanic membrane, ear canal and external ear normal.     Mouth/Throat:     Mouth: Mucous membranes are moist.     Pharynx: Oropharynx is clear.  Eyes:     Extraocular Movements: Extraocular movements intact.     Pupils: Pupils are equal, round, and reactive to light.  Cardiovascular:     Rate and Rhythm: Normal rate and regular rhythm.     Pulses: Normal pulses.     Heart sounds: Normal heart sounds.  Pulmonary:     Effort: Pulmonary effort is normal.     Breath sounds: Normal breath sounds.  Abdominal:     General: Bowel sounds are normal. There is no distension.     Palpations: There is no mass.      Tenderness: There is no abdominal tenderness.     Hernia: No hernia is present.  Genitourinary:    Comments: Deferred  Musculoskeletal:     Right lower leg: No edema.     Left lower leg: No edema.  Lymphadenopathy:     Cervical: No cervical adenopathy.  Skin:    General: Skin is warm.  Neurological:     General: No focal deficit present.     Mental Status: He is alert.     Deep Tendon Reflexes:     Reflex Scores:      Bicep reflexes are 2+ on the right side and 2+ on the left side.      Patellar reflexes are 2+ on the right side and 2+ on the left side.    Comments: Bilateral upper and lower extremity strength 5/5  Psychiatric:        Mood and Affect: Mood normal.        Behavior: Behavior normal.        Thought Content: Thought content normal.        Judgment: Judgment normal.      No results found for any visits on 02/29/24.    The ASCVD Risk score (Arnett DK, et al., 2019) failed to calculate for the following reasons:   The 2019 ASCVD risk score is only valid for ages 66 to 20   * - Cholesterol units were assumed    Assessment & Plan:   Problem List Items Addressed This Visit       Other   Prediabetes   History of the same pending A1c.  Patient currently working on lifestyle modification and has lost approximately 55 pounds      Relevant Orders   Hemoglobin A1c   Lipid panel   Preventative health care - Primary   Discussed age-appropriate immunizations and screening exams.  Did review patient's personal, surgical, social, family history.  Patient is up-to-date with all age-appropriate vaccinations he would like.  Update flu vaccine today.  Patient is too young for CRC screening or prostate cancer screening.  Patient was given information at discharge about preventative healthcare maintenance with anticipatory guidance.  Patient declines STI testing today      Relevant Orders   CBC with Differential/Platelet   Comprehensive metabolic panel with GFR  TSH    Anxiety   History of the same.  Patient was maintained on BuSpar  without great relief.  Will start patient on sertraline  25 mg daily for 10 days then increase to 50 mg daily thereafter.  Referral to therapy also.  Patient denies HI/SI/AVH.      Relevant Medications   sertraline  (ZOLOFT ) 50 MG tablet   Other Relevant Orders   Ambulatory referral to Psychology   Other Visit Diagnoses       Morbid obesity (HCC)       Relevant Orders   Hemoglobin A1c   TSH   Lipid panel     Screening for lipid disorders       Relevant Orders   Lipid panel     Need for influenza vaccination       Relevant Orders   Flu vaccine trivalent PF, 6mos and older(Flulaval,Afluria,Fluarix,Fluzone) (Completed)       Return in about 6 weeks (around 04/11/2024) for GAD.    Adina Crandall, NP  "

## 2024-02-29 NOTE — Patient Instructions (Signed)
 Nice to see you today I will be in touch with the labs once I have them Follow up with me in 6 weeks either in person or virtually  We did up date your flu vaccine today

## 2024-03-04 ENCOUNTER — Ambulatory Visit: Payer: Self-pay | Admitting: Nurse Practitioner

## 2024-04-11 ENCOUNTER — Ambulatory Visit: Admitting: Nurse Practitioner
